# Patient Record
Sex: Female | Born: 1945 | Race: Black or African American | Hispanic: No | Smoking: Never smoker
Health system: Southern US, Community
[De-identification: ages and names within clinical notes are randomized; demographics above are authoritative.]

## PROBLEM LIST (undated history)

## (undated) DIAGNOSIS — I10 Essential (primary) hypertension: Secondary | ICD-10-CM

---

## 2014-03-02 ENCOUNTER — Emergency Department (HOSPITAL_COMMUNITY): Payer: Self-pay

## 2014-03-02 ENCOUNTER — Encounter (HOSPITAL_COMMUNITY): Payer: Self-pay | Admitting: Emergency Medicine

## 2014-03-02 ENCOUNTER — Emergency Department (HOSPITAL_COMMUNITY)
Admission: EM | Admit: 2014-03-02 | Discharge: 2014-03-02 | Disposition: A | Discharge status: Home / Self Care | Payer: Self-pay | Attending: Emergency Medicine | Admitting: Emergency Medicine

## 2014-03-02 DIAGNOSIS — R05 Cough: Secondary | ICD-10-CM | POA: Insufficient documentation

## 2014-03-02 DIAGNOSIS — R059 Cough, unspecified: Secondary | ICD-10-CM

## 2014-03-02 DIAGNOSIS — T464X5A Adverse effect of angiotensin-converting-enzyme inhibitors, initial encounter: Secondary | ICD-10-CM | POA: Insufficient documentation

## 2014-03-02 DIAGNOSIS — R079 Chest pain, unspecified: Secondary | ICD-10-CM | POA: Insufficient documentation

## 2014-03-02 DIAGNOSIS — I10 Essential (primary) hypertension: Secondary | ICD-10-CM | POA: Insufficient documentation

## 2014-03-02 DIAGNOSIS — R1110 Vomiting, unspecified: Secondary | ICD-10-CM | POA: Insufficient documentation

## 2014-03-02 HISTORY — DX: Essential (primary) hypertension: I10

## 2014-03-02 LAB — CBC WITH DIFFERENTIAL/PLATELET
BASOS ABS: 0 10*3/uL (ref 0.0–0.1)
Basophils Relative: 1 % (ref 0–1)
Eosinophils Absolute: 0.2 10*3/uL (ref 0.0–0.7)
Eosinophils Relative: 3 % (ref 0–5)
HCT: 36.6 % (ref 36.0–46.0)
Hemoglobin: 12.2 g/dL (ref 12.0–15.0)
LYMPHS PCT: 38 % (ref 12–46)
Lymphs Abs: 2.6 10*3/uL (ref 0.7–4.0)
MCH: 26 pg (ref 26.0–34.0)
MCHC: 33.3 g/dL (ref 30.0–36.0)
MCV: 77.9 fL — ABNORMAL LOW (ref 78.0–100.0)
Monocytes Absolute: 0.7 10*3/uL (ref 0.1–1.0)
Monocytes Relative: 10 % (ref 3–12)
NEUTROS ABS: 3.2 10*3/uL (ref 1.7–7.7)
Neutrophils Relative %: 48 % (ref 43–77)
Platelets: 276 10*3/uL (ref 150–400)
RBC: 4.7 MIL/uL (ref 3.87–5.11)
RDW: 16.1 % — ABNORMAL HIGH (ref 11.5–15.5)
WBC: 6.7 10*3/uL (ref 4.0–10.5)

## 2014-03-02 LAB — BASIC METABOLIC PANEL
Anion gap: 8 (ref 5–15)
BUN: 21 mg/dL (ref 6–23)
CO2: 28 mmol/L (ref 19–32)
CREATININE: 0.92 mg/dL (ref 0.50–1.10)
Calcium: 9.6 mg/dL (ref 8.4–10.5)
Chloride: 103 mEq/L (ref 96–112)
GFR calc Af Amer: 74 mL/min — ABNORMAL LOW (ref >90)
GFR, EST NON AFRICAN AMERICAN: 63 mL/min — AB (ref >90)
GLUCOSE: 92 mg/dL (ref 70–99)
Potassium: 4.5 mmol/L (ref 3.5–5.1)
Sodium: 139 mmol/L (ref 135–145)

## 2014-03-02 LAB — BRAIN NATRIURETIC PEPTIDE: B Natriuretic Peptide: 29.1 pg/mL (ref 0.0–100.0)

## 2014-03-02 MED ORDER — HYDROCHLOROTHIAZIDE 25 MG PO TABS: 25.0000 mg | ORAL_TABLET | Freq: Every day | ORAL | Status: AC

## 2014-03-02 NOTE — Discharge Instructions (Signed)
Stop taking your lisinopril, begin hydrochlorothiazide and follow up with your physician. Cough, Adult  A cough is a reflex that helps clear your throat and airways. It can help heal the body or may be a reaction to an irritated airway. A cough may only last 2 or 3 weeks (acute) or may last more than 8 weeks (chronic).  CAUSES Acute cough:  Viral or bacterial infections. Chronic cough:  Infections.  Allergies.  Asthma.  Post-nasal drip.  Smoking.  Heartburn or acid reflux.  Some medicines.  Chronic lung problems (COPD).  Cancer. SYMPTOMS   Cough.  Fever.  Chest pain.  Increased breathing rate.  High-pitched whistling sound when breathing (wheezing).  Colored mucus that you cough up (sputum). TREATMENT   A bacterial cough may be treated with antibiotic medicine.  A viral cough must run its course and will not respond to antibiotics.  Your caregiver may recommend other treatments if you have a chronic cough. HOME CARE INSTRUCTIONS   Only take over-the-counter or prescription medicines for pain, discomfort, or fever as directed by your caregiver. Use cough suppressants only as directed by your caregiver.  Use a cold steam vaporizer or humidifier in your bedroom or home to help loosen secretions.  Sleep in a semi-upright position if your cough is worse at night.  Rest as needed.  Stop smoking if you smoke. SEEK IMMEDIATE MEDICAL CARE IF:   You have pus in your sputum.  Your cough starts to worsen.  You cannot control your cough with suppressants and are losing sleep.  You begin coughing up blood.  You have difficulty breathing.  You develop pain which is getting worse or is uncontrolled with medicine.  You have a fever. MAKE SURE YOU:   Understand these instructions.  Will watch your condition.  Will get help right away if you are not doing well or get worse. Document Released: 08/03/2010 Document Revised: 04/29/2011 Document Reviewed:  08/03/2010 [facility] Patient Information 2015 [employer] [place]. This information is not intended to replace advice given to you by your health care provider. Make sure you discuss any questions you have with your health care provider.

## 2014-03-02 NOTE — ED Provider Notes (Signed)
This 69 year old female [place] from [place] had left her blood pressure medicine at home. She was started on lisinopril about 5 weeks ago. 2 weeks ago, she started having a nonproductive cough. There's no fever or dyspnea and no chest pain. On exam, she is noted to have moderate hypertension. Lungs are clear and heart has regular rate and rhythm. There is no peripheral edema. Chest x-ray shows no infiltrates. This likely is ACE inhibitor induced cough. She will be taken off of lisinopril and started on hydrochlorothiazide and referred back to her PCP.  I saw and evaluated the patient, reviewed the resident's note and I agree with the findings and plan.   EKG Interpretation   Date/Time:  Wednesday March 02 2014 15:52:44 EST Ventricular Rate:  84 PR Interval:  157 QRS Duration: 84 QT Interval:  380 QTC Calculation: 449 R Axis:   -28 Text Interpretation:  Sinus rhythm Abnormal R-wave progression, early  transition Left ventricular hypertrophy Baseline wander in lead(s) I III  aVL aVF No old tracing to compare Confirmed by [facility]GLICK  MD, Skiler Olden ([id number]) on  03/02/2014 3:57:14 PM        Dione Boozeavid Frantz Quattrone, MD 03/02/14 [phone]

## 2014-03-02 NOTE — ED Provider Notes (Signed)
CSN: [id number]     Arrival date & time 03/02/14  1341 History   First MD Initiated Contact with Patient 03/02/14 1530     Chief Complaint  Patient presents with  . Cough     (Consider location/radiation/quality/duration/timing/severity/associated sxs/prior Treatment) Patient is a 69 y.o. female presenting with cough.  Cough Cough characteristics:  Non-productive Severity:  Moderate Onset quality:  Gradual Duration:  2 weeks Timing:  Constant Progression:  Waxing and waning Chronicity:  New Context: not sick contacts and not upper respiratory infection   Relieved by:  Nothing Worsened by:  Nothing tried Ineffective treatments: over the counter cold medicines, robitussin. Associated symptoms: chest pain (only with coughing)   Associated symptoms: no diaphoresis, no ear pain, no fever, no headaches, no rash, no rhinorrhea, no shortness of breath, no sinus congestion, no sore throat and no wheezing     Past Medical History  Diagnosis Date  . Hypertension    History reviewed. No pertinent past surgical history. History reviewed. No pertinent family history. History  Substance Use Topics  . Smoking status: Never Smoker   . Smokeless tobacco: Not on file  . Alcohol Use: No   OB History    No data available     Review of Systems  Constitutional: Negative for fever, diaphoresis, appetite change and fatigue.  HENT: Negative for ear pain, rhinorrhea and sore throat.   Eyes: Negative for visual disturbance.  Respiratory: Positive for cough. Negative for shortness of breath and wheezing.   Cardiovascular: Positive for chest pain (only with coughing).  Gastrointestinal: Positive for vomiting (occasionally with coughing). Negative for abdominal pain.  Genitourinary: Negative for difficulty urinating.  Musculoskeletal: Negative for back pain and neck pain.  Skin: Negative for rash.  Neurological: Negative for syncope and headaches.      Allergies  Review of patient's  allergies indicates no known allergies.  Home Medications   Prior to Admission medications   Not on File   BP 187/96 mmHg  Pulse 92  Temp(Src) 98.2 F (36.8 C) (Oral)  Resp 20  SpO2 100% Physical Exam  Constitutional: She is oriented to person, place, and time. She appears well-developed and well-nourished. No distress.  HENT:  Head: Normocephalic and atraumatic.  Eyes: Conjunctivae and EOM are normal.  Neck: Normal range of motion.  Cardiovascular: Normal rate, regular rhythm, normal heart sounds and intact distal pulses.  Exam reveals no gallop and no friction rub.   No murmur heard. Pulmonary/Chest: Effort normal and breath sounds normal. No respiratory distress. She has no wheezes. She has no rales.  Abdominal: Soft. She exhibits no distension. There is no tenderness. There is no guarding.  Musculoskeletal: She exhibits no edema or tenderness.  Neurological: She is alert and oriented to person, place, and time.  Skin: Skin is warm and dry. No rash noted. She is not diaphoretic. No erythema.  Nursing note and vitals reviewed.   ED Course  Procedures (including critical care time) Labs Review Labs Reviewed  CBC WITH DIFFERENTIAL  BRAIN NATRIURETIC PEPTIDE  BASIC METABOLIC PANEL    Imaging Review Dg Chest 2 View (if Patient Has Fever And/or Copd)  03/02/2014   CLINICAL DATA:  Cough.  EXAM: CHEST  2 VIEW  COMPARISON:  None.  FINDINGS: There is cardiomegaly without edema. Lungs are clear. No pneumothorax or pleural effusion. Lumbar spondylosis noted.  IMPRESSION: Cardiomegaly without acute disease.   Electronically Signed   By: Drusilla Kannerhomas  Dalessio M.D.   On: 03/02/2014 15:14  EKG Interpretation None      MDM   Final diagnoses:  None     69 year old female with a history of hypertension prep presents with concern of cough and starting her ACE inhibitor a few weeks ago. Patient is from [place] and has been visiting since October and history is not concerning for TB.  Chest x-ray does not was done and showed no evidence of pneumonia or CHF. BNP was within normal limits. BMP and CBC were also within normal limits. Patient does not have any other symptoms to suggest a URI or viral cause of cough and has no sick contacts. Given she recently started lisinopril feel her cough is most likely secondary to an ACE inhibitor induced cough. Her lisinopril was discontinued and she is given a new prescription for hydrochlorothiazide. It was recommended she follow up closely with her primary care physician regarding blood pressures and to monitor for cough improvement. She was discharged in stable condition with understanding of reasons to return     Alvira Monday, MD 03/03/14 1610  Dione Booze, MD 03/03/14 1350

## 2014-03-02 NOTE — ED Notes (Signed)
Pt c/o coughing x 1 week; pt sts some issues with controlling her htn with meds; pt came here from [place] in October

## 2016-04-14 IMAGING — DX DG CHEST 2V
2 series · 2 of 2 positions shown · non-contrast
Comparison: None.

CLINICAL DATA: Cough.

EXAM:
CHEST  2 VIEW

[chest pa]
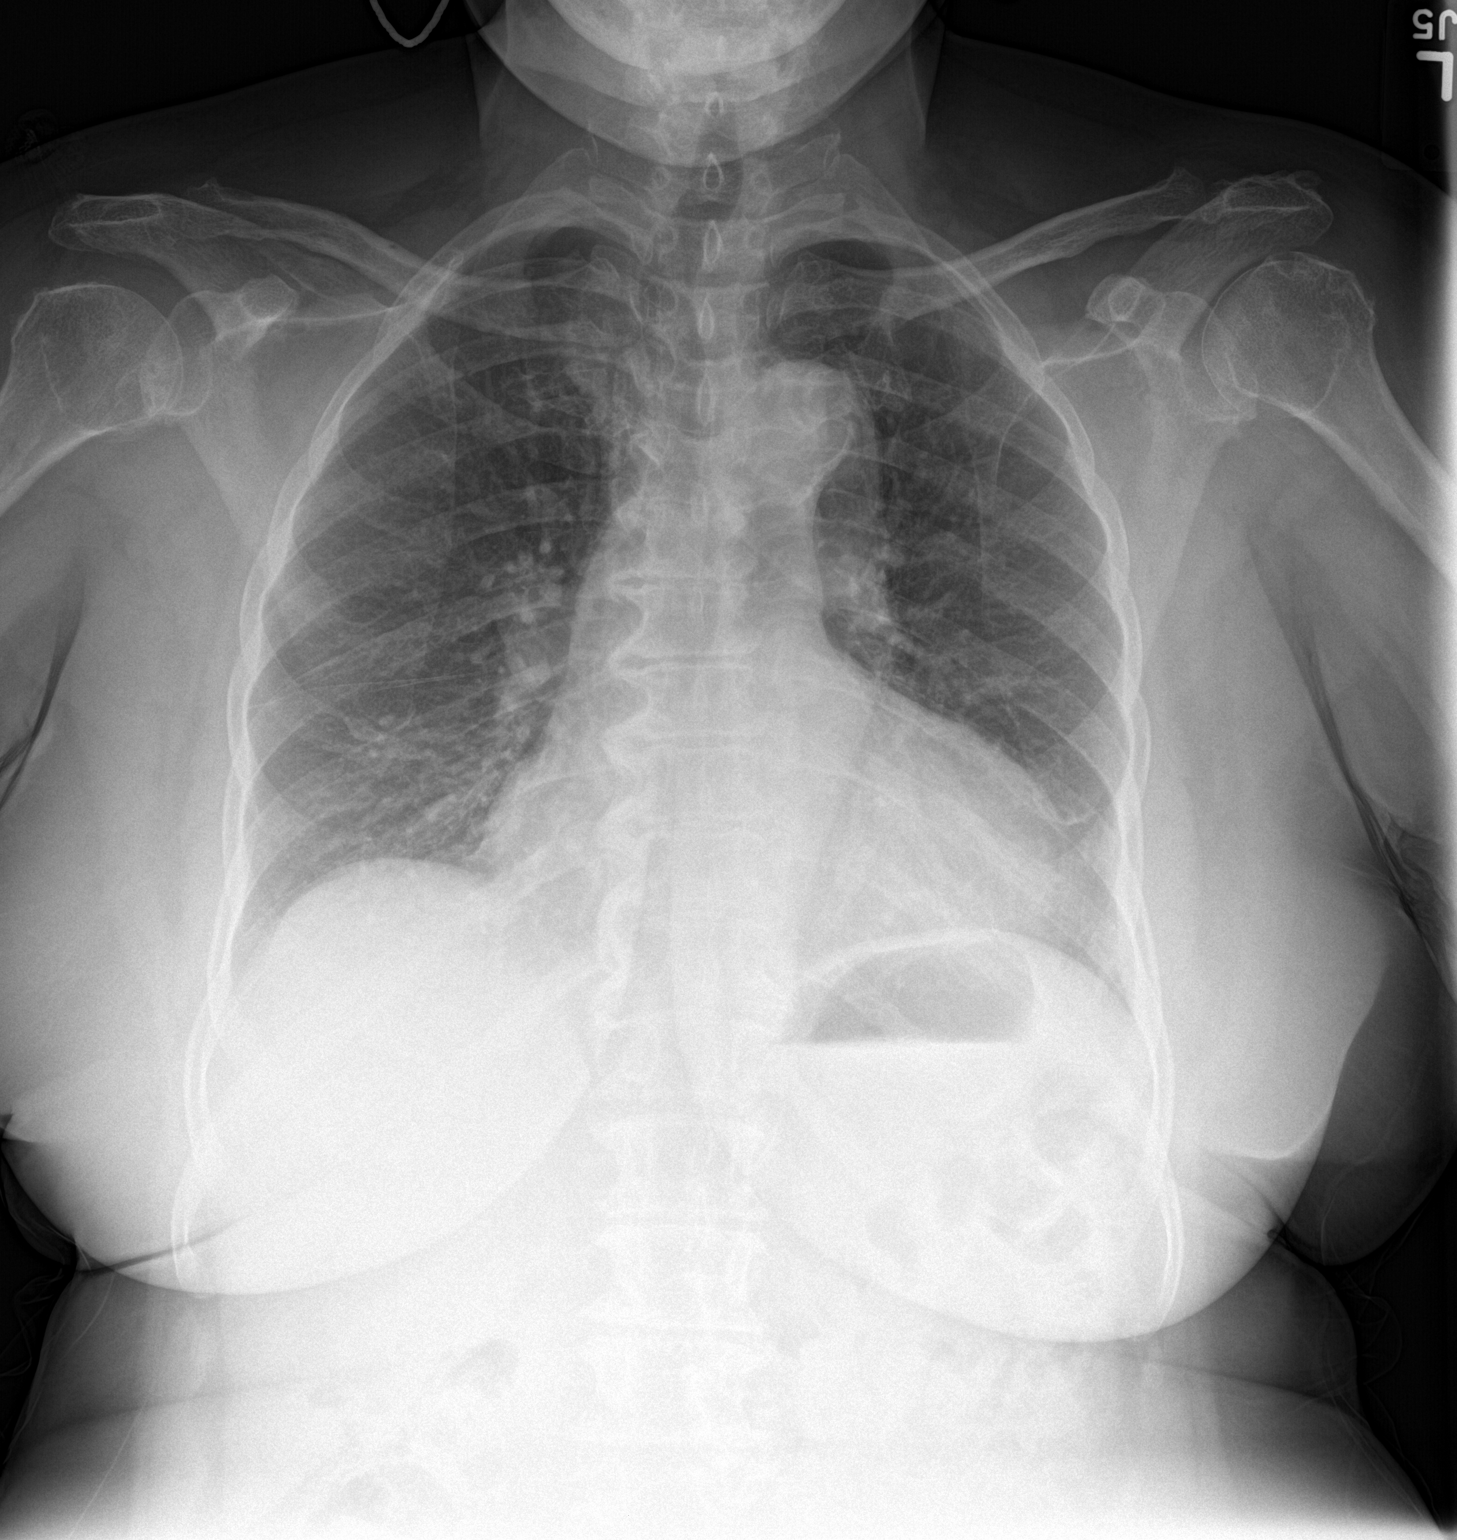

[chest lat]
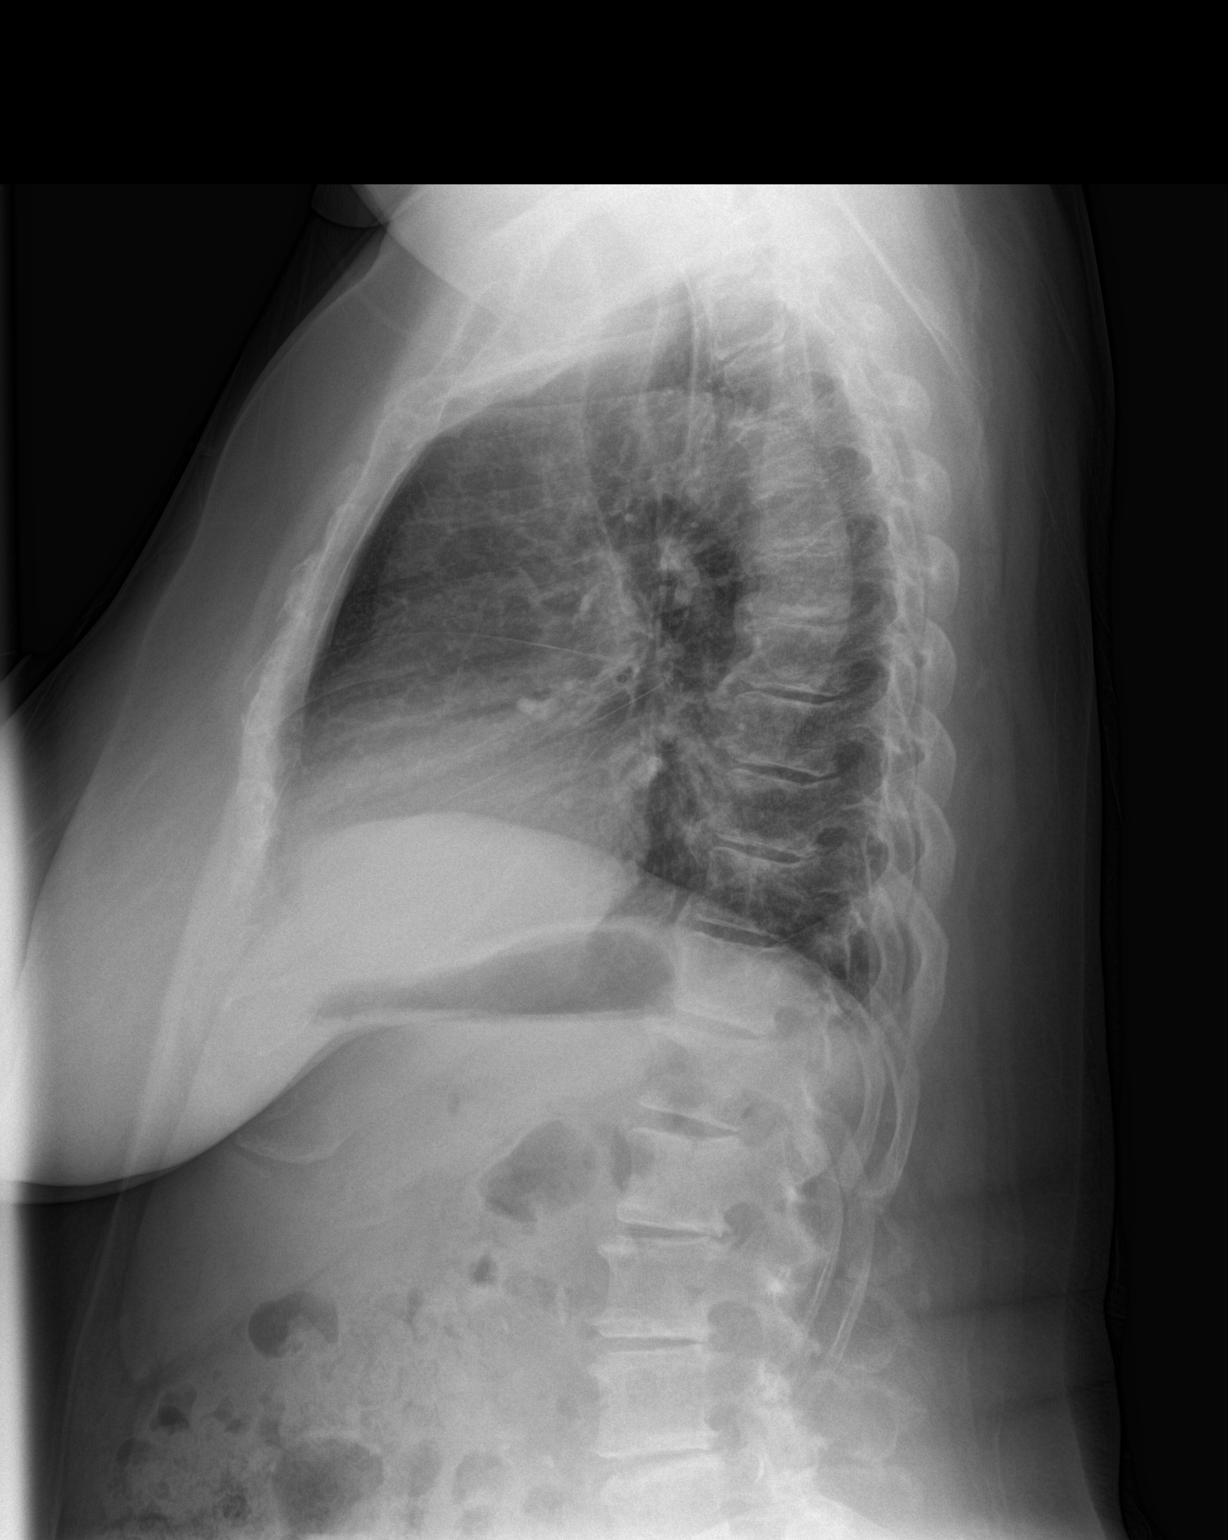

[2 of 2 positions shown; findings below may reference images not displayed]

FINDINGS: There is cardiomegaly without edema. Lungs are clear. No
pneumothorax or pleural effusion. Lumbar spondylosis noted.
IMPRESSION: Cardiomegaly without acute disease.
